# Patient Record
Sex: Male | Born: 1945 | Race: Asian | Hispanic: No | Marital: Married | Smoking: Never smoker
Health system: Southern US, Community
[De-identification: ages and names within clinical notes are randomized; demographics above are authoritative.]

## PROBLEM LIST (undated history)

## (undated) DIAGNOSIS — R0600 Dyspnea, unspecified: Secondary | ICD-10-CM

## (undated) DIAGNOSIS — R0609 Other forms of dyspnea: Secondary | ICD-10-CM

## (undated) DIAGNOSIS — K828 Other specified diseases of gallbladder: Secondary | ICD-10-CM

## (undated) DIAGNOSIS — I10 Essential (primary) hypertension: Secondary | ICD-10-CM

## (undated) HISTORY — DX: Other forms of dyspnea: R06.09

## (undated) HISTORY — DX: Dyspnea, unspecified: R06.00

## (undated) HISTORY — DX: Other specified diseases of gallbladder: K82.8

---

## 2002-03-14 HISTORY — PX: LAPAROSCOPIC CHOLECYSTECTOMY: SUR755

## 2002-03-14 HISTORY — PX: INTRAOPERATIVE CHOLANGIOGRAM: SHX5230

## 2002-12-19 ENCOUNTER — Ambulatory Visit (HOSPITAL_COMMUNITY): Admission: RE | Admit: 2002-12-19 | Discharge: 2002-12-19 | Payer: Self-pay | Admitting: Cardiology

## 2003-01-08 ENCOUNTER — Encounter: Admission: RE | Admit: 2003-01-08 | Discharge: 2003-01-08 | Payer: Self-pay | Admitting: Gastroenterology

## 2003-02-10 ENCOUNTER — Ambulatory Visit (HOSPITAL_COMMUNITY): Admission: RE | Admit: 2003-02-10 | Discharge: 2003-02-10 | Payer: Self-pay | Admitting: Gastroenterology

## 2003-02-27 ENCOUNTER — Observation Stay (HOSPITAL_COMMUNITY): Admission: RE | Admit: 2003-02-27 | Discharge: 2003-02-28 | Payer: Self-pay | Admitting: Surgery

## 2006-01-10 ENCOUNTER — Other Ambulatory Visit: Admission: RE | Admit: 2006-01-10 | Discharge: 2006-01-10 | Payer: Self-pay | Admitting: General Surgery

## 2006-01-23 ENCOUNTER — Other Ambulatory Visit: Admission: RE | Admit: 2006-01-23 | Discharge: 2006-01-23 | Payer: Self-pay | Admitting: Diagnostic Radiology

## 2010-06-11 ENCOUNTER — Encounter: Payer: Self-pay | Admitting: Cardiovascular Disease

## 2010-06-14 ENCOUNTER — Encounter: Payer: Self-pay | Admitting: Cardiovascular Disease

## 2010-06-14 ENCOUNTER — Ambulatory Visit (INDEPENDENT_AMBULATORY_CARE_PROVIDER_SITE_OTHER): Payer: PRIVATE HEALTH INSURANCE | Admitting: Cardiovascular Disease

## 2010-06-14 VITALS — BP 121/81 | HR 75 | Resp 14 | Ht 70.0 in | Wt 166.0 lb

## 2010-06-14 DIAGNOSIS — R55 Syncope and collapse: Secondary | ICD-10-CM

## 2010-06-14 DIAGNOSIS — I10 Essential (primary) hypertension: Secondary | ICD-10-CM

## 2010-06-14 NOTE — Assessment & Plan Note (Signed)
Normal exam and ECG.  May be benign cough syncope.  Negative w/u for similar 7 years ago per patient.  F/U stress echo.  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

## 2010-06-14 NOTE — Progress Notes (Signed)
65 yo [place] male referred from [facility] for "syncope".  Had episode 7 years ago with negative cardiac w/u.  Does not remember who saw him.  No intervening episodes.  Only records in echart involve cholycystectomy in 2004.  Two weeks ago had some abdominal pain and cough.  Occurred at home not work.  No palpitations or SSCP.  Mild chronic exertional dyspnea.  Describes "falling out" No history of head trauma or seizures.  May have cut his lip a bit but no major trauma.  No post ictal state.  No previous history of significant arrhythmia or cardiac problem.  CRF HTN on Rx.  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    ROS: Denies fever, malais, weight loss, blurry vision, decreased visual acuity, cough, sputum, SOB, hemoptysis, pleuritic pain, palpitaitons, heartburn, abdominal pain, melena, lower extremity edema, claudication, or rash.   General: Affect appropriate Healthy:  appears stated age HEENT: normal Neck supple with no adenopathy JVP normal no bruits no thyromegaly Lungs clear with no wheezing and good diaphragmatic motion Heart:  S1/S2 no murmur,rub, gallop or click PMI normal Abdomen: benighn, BS positve, no tenderness, no AAA no bruit.  No HSM or HJR Distal pulses intact with no bruits No edema Neuro non-focal Skin warm and dry No muscular weakness   Current Outpatient Prescriptions  Medication Sig Dispense Refill  . lisinopril (PRINIVIL,ZESTRIL) 10 MG tablet Take 10 mg by mouth daily.        . Multiple Vitamin (MULTIVITAMIN) capsule Take 1 capsule by mouth daily.          Allergies  Review of patient's allergies indicates no known allergies.  Family History :  Negative for premature CAD  Social:  Married  Came from [place] in 80;s  Older children.  Sedentary with occasional ETOH and no smoking  Electrocardiogram:      NSR 75 Normal ECG QT 376 Assessment and Plan

## 2010-06-14 NOTE — Patient Instructions (Signed)
Your physician has requested that you have a stress echocardiogram. For further information please visit [email or web address] Please follow instruction sheet as given.   REFERRAL TO EP FOR SYNCOPE

## 2010-06-14 NOTE — Assessment & Plan Note (Signed)
Well controlled continue ACe

## 2010-06-15 NOTE — Progress Notes (Signed)
Addended by: Kem Parkinson on: 06/15/2010 04:49 PM   Modules accepted: Orders

## 2010-06-28 ENCOUNTER — Ambulatory Visit (HOSPITAL_COMMUNITY): Payer: PRIVATE HEALTH INSURANCE | Attending: Cardiovascular Disease | Admitting: Radiology

## 2010-06-28 ENCOUNTER — Ambulatory Visit (HOSPITAL_BASED_OUTPATIENT_CLINIC_OR_DEPARTMENT_OTHER): Payer: PRIVATE HEALTH INSURANCE | Admitting: Radiology

## 2010-06-28 DIAGNOSIS — R55 Syncope and collapse: Secondary | ICD-10-CM | POA: Insufficient documentation

## 2010-06-28 DIAGNOSIS — R072 Precordial pain: Secondary | ICD-10-CM

## 2010-06-28 DIAGNOSIS — R0989 Other specified symptoms and signs involving the circulatory and respiratory systems: Secondary | ICD-10-CM

## 2010-06-29 ENCOUNTER — Encounter: Payer: Self-pay | Admitting: Cardiovascular Disease

## 2010-07-08 ENCOUNTER — Telehealth: Payer: Self-pay | Admitting: Cardiovascular Disease

## 2010-07-08 DIAGNOSIS — R55 Syncope and collapse: Secondary | ICD-10-CM

## 2010-07-08 NOTE — Telephone Encounter (Signed)
Patient wanted results from stress echo. She is aware that Dr. Eden Emms would like for her to have a lexiscan myoview. I have put the order in & will have the PCC's schedule this.

## 2010-07-19 ENCOUNTER — Ambulatory Visit (HOSPITAL_COMMUNITY): Payer: PRIVATE HEALTH INSURANCE | Attending: Cardiovascular Disease | Admitting: Radiology

## 2010-07-19 DIAGNOSIS — I4949 Other premature depolarization: Secondary | ICD-10-CM

## 2010-07-19 DIAGNOSIS — R0609 Other forms of dyspnea: Secondary | ICD-10-CM

## 2010-07-19 DIAGNOSIS — R0602 Shortness of breath: Secondary | ICD-10-CM

## 2010-07-19 DIAGNOSIS — R55 Syncope and collapse: Secondary | ICD-10-CM

## 2010-07-19 MED ORDER — TECHNETIUM TC 99M TETROFOSMIN IV KIT
11.0000 | PACK | Freq: Once | INTRAVENOUS | Status: AC | PRN
  Administered 2010-07-19: 11 via INTRAVENOUS

## 2010-07-19 MED ORDER — REGADENOSON 0.4 MG/5ML IV SOLN
0.4000 mg | Freq: Once | INTRAVENOUS | Status: AC
  Administered 2010-07-19: 0.4 mg via INTRAVENOUS

## 2010-07-19 MED ORDER — TECHNETIUM TC 99M TETROFOSMIN IV KIT
33.0000 | PACK | Freq: Once | INTRAVENOUS | Status: AC | PRN
  Administered 2010-07-19: 33 via INTRAVENOUS

## 2010-07-19 NOTE — Progress Notes (Signed)
[facility] SITE 3 NUCLEAR MED [address] [place] [address] [phone]  Cardiology Nuclear Med Study  Eric Love is a 65 y.o. male [id number] Dec 10, 1945   Nuclear Med Background Indication for Stress Test:  Evaluation for Ischemia and 06/28/10 Stress echo was nondiagnostic with inferior wall not seen. History: 06/28/10 Echo: Stress NonDx EF 55% Poor imaging quality Cardiac Risk Factors: Hypertension  Symptoms:  DOE, Fatigue and Syncope   Nuclear Pre-Procedure Caffeine/Decaff Intake:  None NPO After: 8:00am   Lungs: clear IV 0.9% NS with Angio Cath:  20g  IV Site: R Forearm  IV Started by:  Cathlyn Parsons, RN  Chest Size (in):  40 Cup Size: n/a  Height: 5\' 8"  (1.727 m)  Weight:  159 lb (72.122 kg)  BMI:  Body mass index is 24.18 kg/(m^2). Tech Comments:  n/a    Nuclear Med Study 1 or 2 day study: 1 day  Stress Test Type:  Treadmill/Lexiscan  Reading MD: Charlton Haws, MD  Order Authorizing Provider:  P.Nishan  Resting Radionuclide: Technetium 25m Tetrofosmin  Resting Radionuclide Dose: 11.0 mCi   Stress Radionuclide:  Technetium 34m Tetrofosmin  Stress Radionuclide Dose: 33.0 mCi           Stress Protocol Rest HR: 68 Stress HR: 100  Rest BP: 120/78 Stress BP: 128/81  Exercise Time (min): n/a METS: n/a   Predicted Max HR: 156 bpm % Max HR: 64.1 bpm Rate Pressure Product: 13244   Dose of Adenosine (mg):  n/a Dose of Lexiscan: 0.4 mg  Dose of Atropine (mg): n/a Dose of Dobutamine: n/a mcg/kg/min (at max HR)  Stress Test Technologist: Milana Na, EMT-P  Nuclear Technologist:  Domenic Polite, CNMT     Rest Procedure:  Myocardial perfusion imaging was performed at rest 45 minutes following the intravenous administration of Technetium 12m Tetrofosmin. Rest ECG: NSR PVCS  Stress Procedure:  The patient received IV Lexiscan 0.4 mg over 15-seconds with concurrent low level exercise and then Technetium 7m Tetrofosmin was injected at  30-seconds while the patient continued walking one more minute.  There were no significant changes and occ pvcs with Lexiscan.  Quantitative spect images were obtained after a 45-minute delay. Stress ECG: No significant change from baseline ECG  QPS Raw Data Images:  Normal; no motion artifact; normal heart/lung ratio. Stress Images:  Normal homogeneous uptake in all areas of the myocardium. Rest Images:  Normal homogeneous uptake in all areas of the myocardium. Subtraction (SDS):  Normal Transient Ischemic Dilatation (Normal <1.22):  0.95 Lung/Heart Ratio (Normal <0.45):  0.29  Quantitative Gated Spect Images QGS EDV:  83 ml QGS ESV:  29 ml QGS cine images:  NL LV Function; NL Wall Motion QGS EF: 65%  Impression Exercise Capacity:  Lexiscan with no exercise. BP Response:  Normal blood pressure response. Clinical Symptoms:  No chest pain. ECG Impression:  No significant ST segment change suggestive of ischemia. Comparison with Prior Nuclear Study: No images to compare  Overall Impression:  Normal stress nuclear study.       Charlton Haws

## 2010-07-20 NOTE — Progress Notes (Signed)
ROUTED TO DR. NISHAN.Falecha Clark °

## 2010-07-28 NOTE — Progress Notes (Signed)
pt aware of myoview results Shaolin Armas  

## 2017-03-20 ENCOUNTER — Other Ambulatory Visit: Payer: Self-pay

## 2017-03-20 ENCOUNTER — Emergency Department (HOSPITAL_COMMUNITY): Payer: Medicare Other

## 2017-03-20 ENCOUNTER — Emergency Department (HOSPITAL_COMMUNITY)
Admission: EM | Admit: 2017-03-20 | Discharge: 2017-03-20 | Disposition: A | Discharge status: Home / Self Care | Payer: Medicare Other | Attending: Emergency Medicine | Admitting: Emergency Medicine

## 2017-03-20 ENCOUNTER — Encounter (HOSPITAL_COMMUNITY): Payer: Self-pay

## 2017-03-20 DIAGNOSIS — R112 Nausea with vomiting, unspecified: Secondary | ICD-10-CM | POA: Diagnosis not present

## 2017-03-20 DIAGNOSIS — K828 Other specified diseases of gallbladder: Secondary | ICD-10-CM | POA: Insufficient documentation

## 2017-03-20 DIAGNOSIS — I10 Essential (primary) hypertension: Secondary | ICD-10-CM | POA: Insufficient documentation

## 2017-03-20 DIAGNOSIS — Z79899 Other long term (current) drug therapy: Secondary | ICD-10-CM | POA: Diagnosis not present

## 2017-03-20 DIAGNOSIS — R42 Dizziness and giddiness: Secondary | ICD-10-CM | POA: Diagnosis not present

## 2017-03-20 LAB — COMPREHENSIVE METABOLIC PANEL
ALT: 25 U/L (ref 17–63)
AST: 26 U/L (ref 15–41)
Albumin: 4.3 g/dL (ref 3.5–5.0)
Alkaline Phosphatase: 60 U/L (ref 38–126)
Anion gap: 8 (ref 5–15)
BUN: 17 mg/dL (ref 6–20)
CO2: 26 mmol/L (ref 22–32)
Calcium: 9.7 mg/dL (ref 8.9–10.3)
Chloride: 103 mmol/L (ref 101–111)
Creatinine, Ser: 1.03 mg/dL (ref 0.61–1.24)
GFR calc Af Amer: >60 mL/min (ref >60)
GFR calc non Af Amer: >60 mL/min (ref >60)
Glucose, Bld: 126 mg/dL — ABNORMAL HIGH (ref 65–99)
Potassium: 4.3 mmol/L (ref 3.5–5.1)
Sodium: 137 mmol/L (ref 135–145)
Total Bilirubin: 1.3 mg/dL — ABNORMAL HIGH (ref 0.3–1.2)
Total Protein: 8.6 g/dL — ABNORMAL HIGH (ref 6.5–8.1)

## 2017-03-20 LAB — URINALYSIS, ROUTINE W REFLEX MICROSCOPIC
Bilirubin Urine: NEGATIVE
Glucose, UA: NEGATIVE mg/dL
Hgb urine dipstick: NEGATIVE
Ketones, ur: 5 mg/dL — AB
Leukocytes, UA: NEGATIVE
Nitrite: NEGATIVE
Protein, ur: NEGATIVE mg/dL
Specific Gravity, Urine: 1.014 (ref 1.005–1.030)
pH: 7 (ref 5.0–8.0)

## 2017-03-20 LAB — I-STAT TROPONIN, ED: Troponin i, poc: 0 ng/mL (ref 0.00–0.08)

## 2017-03-20 LAB — CBC
HCT: 42.5 % (ref 39.0–52.0)
Hemoglobin: 14.5 g/dL (ref 13.0–17.0)
MCH: 27.8 pg (ref 26.0–34.0)
MCHC: 34.1 g/dL (ref 30.0–36.0)
MCV: 81.4 fL (ref 78.0–100.0)
Platelets: 332 10*3/uL (ref 150–400)
RBC: 5.22 MIL/uL (ref 4.22–5.81)
RDW: 12.8 % (ref 11.5–15.5)
WBC: 17.7 10*3/uL — ABNORMAL HIGH (ref 4.0–10.5)

## 2017-03-20 LAB — LIPASE, BLOOD: Lipase: 31 U/L (ref 11–51)

## 2017-03-20 MED ORDER — ONDANSETRON 4 MG PO TBDP: 4.0000 mg | ORAL_TABLET | Freq: Three times a day (TID) | ORAL | 0 refills | Status: AC | PRN

## 2017-03-20 MED ORDER — ONDANSETRON 4 MG PO TBDP
4.0000 mg | ORAL_TABLET | Freq: Once | ORAL | Status: AC | PRN
  Administered 2017-03-20: 4 mg via ORAL
  Filled 2017-03-20: qty 1

## 2017-03-20 MED ORDER — LACTATED RINGERS IV BOLUS (SEPSIS)
1000.0000 mL | Freq: Once | INTRAVENOUS | Status: AC
  Administered 2017-03-20: 1000 mL via INTRAVENOUS

## 2017-03-20 MED ORDER — MECLIZINE HCL 25 MG PO TABS: 25.0000 mg | ORAL_TABLET | Freq: Three times a day (TID) | ORAL | 0 refills | Status: AC | PRN

## 2017-03-20 NOTE — ED Provider Notes (Addendum)
[facility]-EMERGENCY DEPT Provider Note   CSN: [id number] Arrival date & time: 03/20/17  0554     History   Chief Complaint Chief Complaint  Patient presents with  . Emesis  . Dizziness    HPI Eric Love is a 72 y.o. male.  HPI  Patient with history of biliary dyskinesia status post cholecystectomy comes in with chief complaint of dizziness and nausea.  Patient reports that he started having nausea yesterday evening.  Patient has had multiple episodes of emesis since then.  The last emesis was bilious.  Patient also reports feeling dizzy, specifically when he is walking.  Patient denies any associated numbness, tingling, vision changes.  Patient does not have any history of stroke.  With the nausea and emesis, there is no abdominal pain or distention.  Patient reports that he had a bowel movement yesterday, however it was small.  Patient is unsure if he is passing flatus or not, no hx of SBO.  Past Medical History:  Diagnosis Date  . Biliary dyskinesia   . Dyspnea on exertion     Patient Active Problem List   Diagnosis Date Noted  . Syncope 06/14/2010  . Essential hypertension, benign 06/14/2010    Past Surgical History:  Procedure Laterality Date  . INTRAOPERATIVE CHOLANGIOGRAM  2004  . LAPAROSCOPIC CHOLECYSTECTOMY  2004       Home Medications    Prior to Admission medications   Medication Sig Start Date End Date Taking? Authorizing Provider  lisinopril (PRINIVIL,ZESTRIL) 10 MG tablet Take 10 mg by mouth daily.     Yes [provider]  Multiple Vitamin (MULTIVITAMIN WITH MINERALS) TABS tablet Take 1 tablet by mouth daily.   Yes [provider]  Polyvinyl Alcohol-Povidone (REFRESH OP) Apply 1 drop to eye at bedtime.   Yes [provider]  tamsulosin (FLOMAX) 0.4 MG CAPS capsule Take 0.4 mg by mouth every evening. 01/04/17  Yes [provider]  meclizine (ANTIVERT) 25 MG tablet Take 1 tablet (25 mg total)  by mouth 3 (three) times daily as needed for dizziness. 03/20/17   Derwood Kaplan, MD  ondansetron (ZOFRAN ODT) 4 MG disintegrating tablet Take 1 tablet (4 mg total) by mouth every 8 (eight) hours as needed for nausea or vomiting. 03/20/17   Derwood Kaplan, MD    Family History No family history on file.  Social History Social History   Tobacco Use  . Smoking status: Never Smoker  . Smokeless tobacco: Never Used  Substance Use Topics  . Alcohol use: No    Frequency: Never  . Drug use: No     Allergies   Patient has no known allergies.   Review of Systems Review of Systems  All other systems reviewed and are negative.    Physical Exam Updated Vital Signs BP 135/84 (BP Location: Left Arm)   Pulse 87   Temp 98 F (36.7 C)   Resp 16   Ht 5\' 5"  (1.651 m)   Wt 69.9 kg (154 lb)   SpO2 100%   BMI 25.63 kg/m   Physical Exam  Constitutional: He is oriented to person, place, and time. He appears well-developed.  HENT:  Head: Atraumatic.  Neck: Neck supple.  Cardiovascular: Normal rate.  Pulmonary/Chest: Effort normal.  Musculoskeletal: He exhibits no edema or tenderness.  Neurological: He is alert and oriented to person, place, and time. No cranial nerve deficit. Coordination normal.  Cerebellar exam is normal (finger to nose) Sensory exam normal for bilateral upper  and lower extremities - and patient is able to discriminate between sharp and dull. Motor exam is 4+/5   Skin: Skin is warm.  Nursing note and vitals reviewed.    ED Treatments / Results  Labs (all labs ordered are listed, but only abnormal results are displayed) Labs Reviewed  COMPREHENSIVE METABOLIC PANEL - Abnormal; Notable for the following components:      Result Value   Glucose, Bld 126 (*)    Total Protein 8.6 (*)    Total Bilirubin 1.3 (*)    All other components within normal limits  CBC - Abnormal; Notable for the following components:   WBC 17.7 (*)    All other components within  normal limits  URINALYSIS, ROUTINE W REFLEX MICROSCOPIC - Abnormal; Notable for the following components:   Ketones, ur 5 (*)    All other components within normal limits  LIPASE, BLOOD  I-STAT TROPONIN, ED    EKG  EKG Interpretation  Date/Time:  Monday March 20 2017 08:43:57 EST Ventricular Rate:  84 PR Interval:    QRS Duration: 75 QT Interval:  362 QTC Calculation: 428 R Axis:   69 Text Interpretation:  Sinus rhythm Consider left atrial enlargement Anterior infarct, old ST elevation, consider inferior injury No acute changes Confirmed by Derwood Kaplananavati, Mariyanna Mucha ([id number]) on 03/20/2017 9:32:11 AM       Radiology Mr Brain Wo Contrast  Result Date: 03/20/2017 CLINICAL DATA:  Vertigo.  Acute presentation. EXAM: MRI HEAD WITHOUT CONTRAST TECHNIQUE: Multiplanar, multiecho pulse sequences of the brain and surrounding structures were obtained without intravenous contrast. COMPARISON:  Head CT 05/26/2010. FINDINGS: Brain: The brain has normal appearance without evidence of malformation, atrophy, old or acute small or large vessel infarction, hemorrhage, hydrocephalus or extra-axial collection. No pituitary abnormality. CP angle regions appear normal. Seventh and eighth nerve complexes appear normal without contrast. Vascular: Major vessels at the base of the brain show flow. Skull and upper cervical spine: Normal Sinuses/Orbits: Clear/ normal. Other: None significant. IMPRESSION: Normal examination.  No cause of vertigo is identified. Electronically Signed   By: Paulina FusiMark  Shogry M.D.   On: 03/20/2017 15:12   Dg Abd Acute W/chest  Result Date: 03/20/2017 CLINICAL DATA:  Nausea, vomiting, dizziness EXAM: DG ABDOMEN ACUTE W/ 1V CHEST COMPARISON:  05/26/2010 FINDINGS: Prior cholecystectomy. The bowel gas pattern is normal. There is no evidence of free intraperitoneal air. No suspicious radio-opaque calculi or other significant radiographic abnormality is seen. Heart size and mediastinal contours are within  normal limits. Both lungs are clear. IMPRESSION: Negative abdominal radiographs.  No acute cardiopulmonary disease. Electronically Signed   By: Charlett NoseKevin  Dover M.D.   On: 03/20/2017 09:19    Procedures Procedures (including critical care time)  Medications Ordered in ED Medications  ondansetron (ZOFRAN-ODT) disintegrating tablet 4 mg (4 mg Oral Given 03/20/17 0726)  lactated ringers bolus 1,000 mL (0 mLs Intravenous Stopped 03/20/17 1009)  lactated ringers bolus 1,000 mL (0 mLs Intravenous Stopped 03/20/17 1254)     Initial Impression / Assessment and Plan / ED Course  I have reviewed the triage vital signs and the nursing notes.  Pertinent labs & imaging results that were available during my care of the patient were reviewed by me and considered in my medical decision making (see chart for details).   Patient comes in with chief complaint of nausea with bilious emesis.  Patient does not have associated abdominal pain or distention.  Patient does have history of cholecystectomy, and he reports having abnormal bowel movements.  Differential diagnosis includes small bowel obstruction (partial vs. Complete), ileus, gastroparesis, gastroenteritis.  Additionally, patient is having nausea and dizziness, which could be related to stroke.  Patient on history appears to have orthostatic-like symptoms, which could be because of dehydration from his emesis.  We will order orthostatics and give patient fluids and reassess.  Final Clinical Impressions(s) / ED Diagnoses   Final diagnoses:  Dizziness  Orthostatic dizziness    ED Discharge Orders        Ordered    meclizine (ANTIVERT) 25 MG tablet  3 times daily PRN     03/20/17 1542    ondansetron (ZOFRAN ODT) 4 MG disintegrating tablet  Every 8 hours PRN     03/20/17 1543       Derwood Kaplan, MD 03/20/17 1552    Derwood Kaplan, MD 03/20/17 1600

## 2017-03-20 NOTE — ED Notes (Signed)
Per MD, patient will go to MRI between 1700-1800.

## 2017-03-20 NOTE — ED Notes (Signed)
Patient's daughter, Dewayne Hatchnn, [phone]

## 2017-03-20 NOTE — ED Notes (Signed)
Patient transported to MRI 

## 2017-03-20 NOTE — ED Notes (Signed)
Per patient request, patient's daughter made aware of patient's d/c and reports she will come to pick him up.

## 2017-03-20 NOTE — ED Notes (Signed)
Per MD, patient can eat and drink. Patient given Mal[place]urkey sandwich and water.

## 2017-03-20 NOTE — Discharge Instructions (Signed)
All the results in the ER are normal, labs and imaging. We are not sure what is causing your symptoms. We suspect that the symptoms are likely due to dehydration/orthostasis or due to benign positional vertigo. Take the medications prescribed as needed.  The workup in the ER is not complete, and is limited to screening for life threatening and emergent conditions only, so please see a primary care doctor for further evaluation in 5 days.  Please return to the ER if your symptoms worsen; you have increased pain, fevers, chills, inability to keep any medications down, confusion. Otherwise see the outpatient doctor as requested.

## 2017-03-20 NOTE — ED Triage Notes (Signed)
Patient c/o vomiting and dizziness since yesterday. Patient denies any abdominal pain or diarrhea.

## 2017-09-13 ENCOUNTER — Encounter (HOSPITAL_COMMUNITY): Payer: Self-pay | Admitting: Emergency Medicine

## 2017-09-13 ENCOUNTER — Ambulatory Visit (HOSPITAL_COMMUNITY)
Admission: EM | Admit: 2017-09-13 | Discharge: 2017-09-13 | Disposition: A | Discharge status: Home / Self Care | Payer: Medicare Other | Attending: Family Medicine | Admitting: Family Medicine

## 2017-09-13 DIAGNOSIS — L237 Allergic contact dermatitis due to plants, except food: Secondary | ICD-10-CM

## 2017-09-13 MED ORDER — TRIAMCINOLONE ACETONIDE 0.5 % EX OINT: 1.0000 "application " | TOPICAL_OINTMENT | Freq: Two times a day (BID) | CUTANEOUS | 0 refills | Status: AC

## 2017-09-13 MED ORDER — HYDROXYZINE HCL 25 MG PO TABS: 25.0000 mg | ORAL_TABLET | Freq: Every evening | ORAL | 0 refills | Status: AC | PRN

## 2017-09-13 NOTE — ED Provider Notes (Signed)
[facility] CARE [facility]   [id number] 09/13/17 Arrival Time: 1449  SUBJECTIVE:  Eric Love is a 72 y.o. male who presents with a rash to right arm.  Began after cleaning out backyard. Localizes the rash to inside of right forearm  Describes it as itchy.  Has tried OTC medications/ creams with temporary improvement.  Symptoms are made worse with itching.  Denies similar symptoms in the past.  Denies fever, chills, nausea, vomiting, erythema, redness, swollen glands, SOB, chest pain, abdominal pain, changes in bowel or bladder function.    ROS: As per HPI.  Past Medical History:  Diagnosis Date  . Biliary dyskinesia   . Dyspnea on exertion    Past Surgical History:  Procedure Laterality Date  . INTRAOPERATIVE CHOLANGIOGRAM  2004  . LAPAROSCOPIC CHOLECYSTECTOMY  2004   No Known Allergies No current facility-administered medications on file prior to encounter.    Current Outpatient Medications on File Prior to Encounter  Medication Sig Dispense Refill  . lisinopril (PRINIVIL,ZESTRIL) 10 MG tablet Take 10 mg by mouth daily.      . meclizine (ANTIVERT) 25 MG tablet Take 1 tablet (25 mg total) by mouth 3 (three) times daily as needed for dizziness. 30 tablet 0  . Multiple Vitamin (MULTIVITAMIN WITH MINERALS) TABS tablet Take 1 tablet by mouth daily.    . ondansetron (ZOFRAN ODT) 4 MG disintegrating tablet Take 1 tablet (4 mg total) by mouth every 8 (eight) hours as needed for nausea or vomiting. 20 tablet 0  . Polyvinyl Alcohol-Povidone (REFRESH OP) Apply 1 drop to eye at bedtime.    . tamsulosin (FLOMAX) 0.4 MG CAPS capsule Take 0.4 mg by mouth every evening.  3   Social History   Socioeconomic History  . Marital status: Married    Spouse name: Not on file  . Number of children: Not on file  . Years of education: Not on file  . Highest education level: Not on file  Occupational History  . Not on file  Social Needs  . Financial resource strain: Not on file  . Food insecurity:   Worry: Not on file    Inability: Not on file  . Transportation needs:    Medical: Not on file    Non-medical: Not on file  Tobacco Use  . Smoking status: Never Smoker  . Smokeless tobacco: Never Used  Substance and Sexual Activity  . Alcohol use: No    Frequency: Never  . Drug use: No  . Sexual activity: Not on file  Lifestyle  . Physical activity:    Days per week: Not on file    Minutes per session: Not on file  . Stress: Not on file  Relationships  . Social connections:    Talks on phone: Not on file    Gets together: Not on file    Attends religious service: Not on file    Active member of club or organization: Not on file    Attends meetings of clubs or organizations: Not on file    Relationship status: Not on file  . Intimate partner violence:    Fear of current or ex partner: Not on file    Emotionally abused: Not on file    Physically abused: Not on file    Forced sexual activity: Not on file  Other Topics Concern  . Not on file  Social History Narrative  . Not on file   History reviewed. No pertinent family history.  OBJECTIVE: Vitals:   09/13/17 1508  BP: 111/76  Pulse: 82  Resp: 18  Temp: 97.7 F (36.5 C)  TempSrc: Oral  SpO2: 95%    General appearance: alert; no distress Lungs: clear to auscultation bilaterally Heart: regular rate and rhythm.  Radial pulse 2+ bilaterally Extremities: no edema Skin: warm and dry; scatter papules and vesicles with surrounding erythema in linear distribution on the anterior medial aspect of right forearm areas of linear  Psychological: alert and cooperative; normal mood and affect  ASSESSMENT & PLAN:  1. Allergic contact dermatitis due to plants, except food     Meds ordered this encounter  Medications  . triamcinolone ointment (KENALOG) 0.5 %    Sig: Apply 1 application topically 2 (two) times daily.    Dispense:  30 g    Refill:  0    Order Specific Question:   Supervising Provider    Answer:   Isa RankinMURRAY,  LAURA WILSON [phone][[id number]]  . hydrOXYzine (ATARAX/VISTARIL) 25 MG tablet    Sig: Take 1 tablet (25 mg total) by mouth at bedtime as needed for itching.    Dispense:  12 tablet    Refill:  0    Order Specific Question:   Supervising Provider    Answer:   Isa RankinMURRAY, LAURA WILSON [[id number]]   You can try oatmeal baths and cool, wet compresses Continue OTC medications/ cream as needed for symptomatic relief Prescribed kenalog cream.  Use as prescribed Prescribed hydroxyzine.  Use as needed for itching.  Avoid driving or operating heavy machinery while taking this medication.  It can make you drowsy Follow up with PCP if symptoms persists Return or go to the ER if you have any new or worsening symptoms   Reviewed expectations re: course of current medical issues. Questions answered. Outlined signs and symptoms indicating need for more acute intervention. Patient verbalized understanding. After Visit Summary given.   Rennis HardingWurst, Jaydn Fincher, PA-C 09/13/17 1556

## 2017-09-13 NOTE — Discharge Instructions (Addendum)
You can try oatmeal baths and cool, wet compresses Continue OTC medications/ cream as needed for symptomatic relief Prescribed kenalog cream.  Use as prescribed Prescribed hydroxyzine.  Use as needed for itching.  Avoid driving or operating heavy machinery while taking this medication.  It can make you drowsy Follow up with PCP if symptoms persists Return or go to the ER if you have any new or worsening symptoms

## 2017-09-13 NOTE — ED Triage Notes (Signed)
Pt here for rash from poison ivy 

## 2017-12-29 ENCOUNTER — Encounter: Payer: Self-pay | Admitting: Family Medicine

## 2018-01-23 ENCOUNTER — Encounter: Payer: Self-pay | Admitting: Internal Medicine

## 2019-12-06 IMAGING — CR DG ABDOMEN ACUTE W/ 1V CHEST
3 series · 3 of 3 positions shown · non-contrast
Comparison: 05/26/2010

CLINICAL DATA: Nausea, vomiting, dizziness

EXAM:
DG ABDOMEN ACUTE W/ 1V CHEST

[w chest pa]
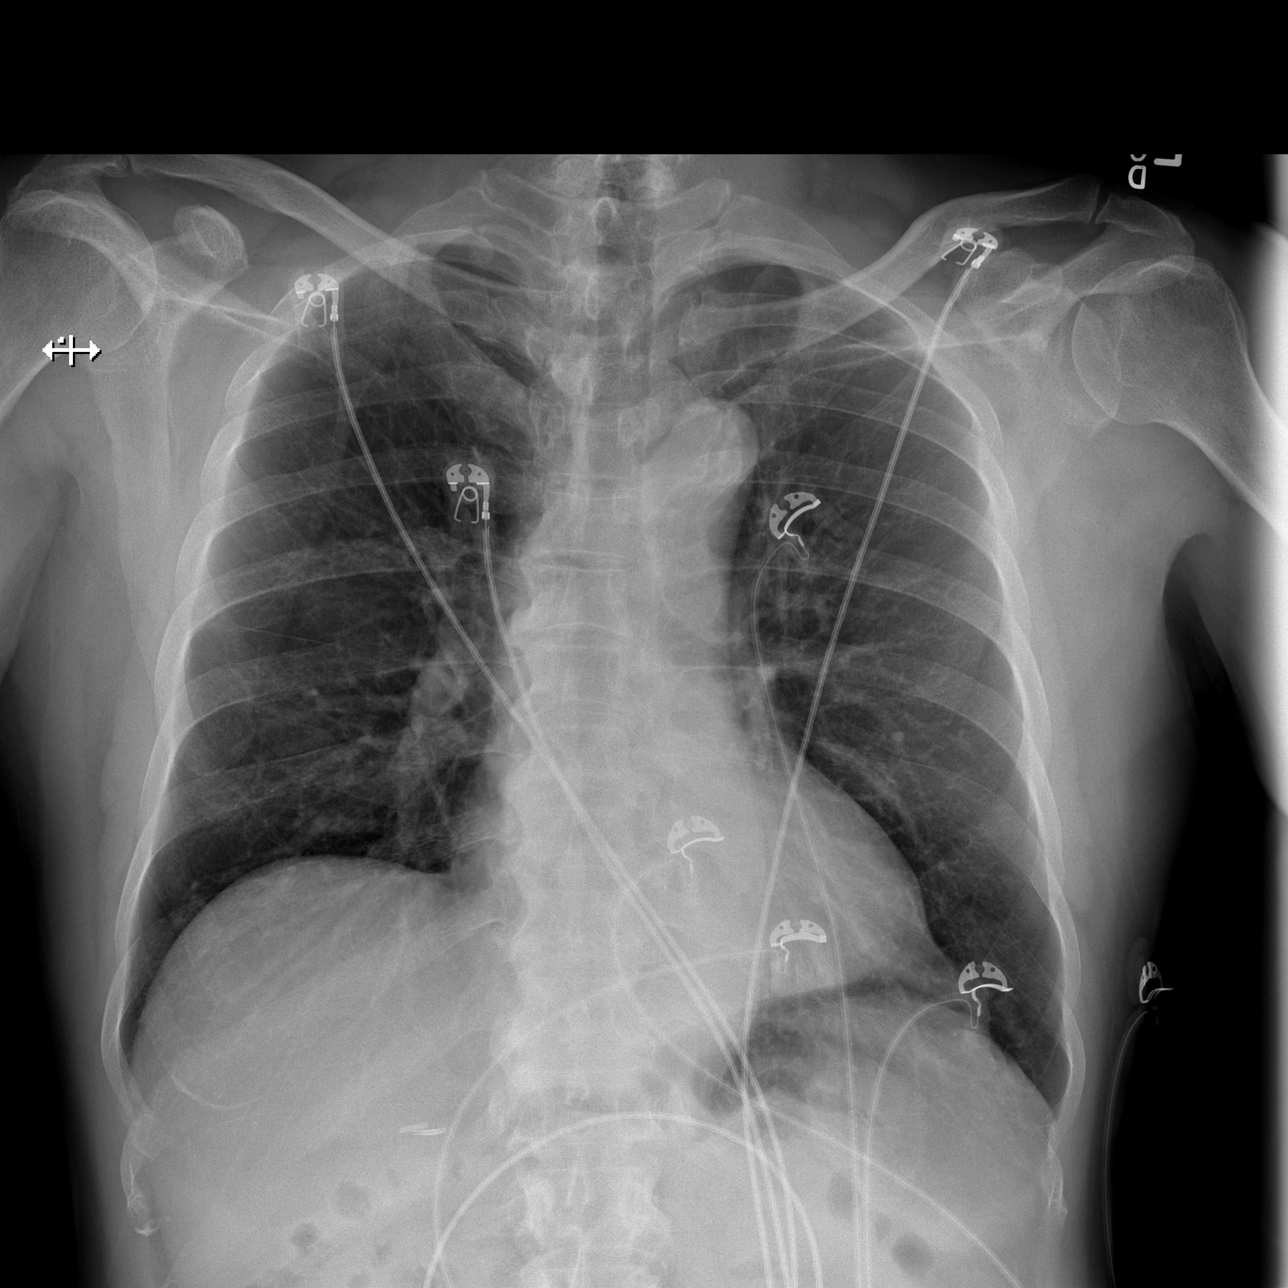

[w abdomen upright]
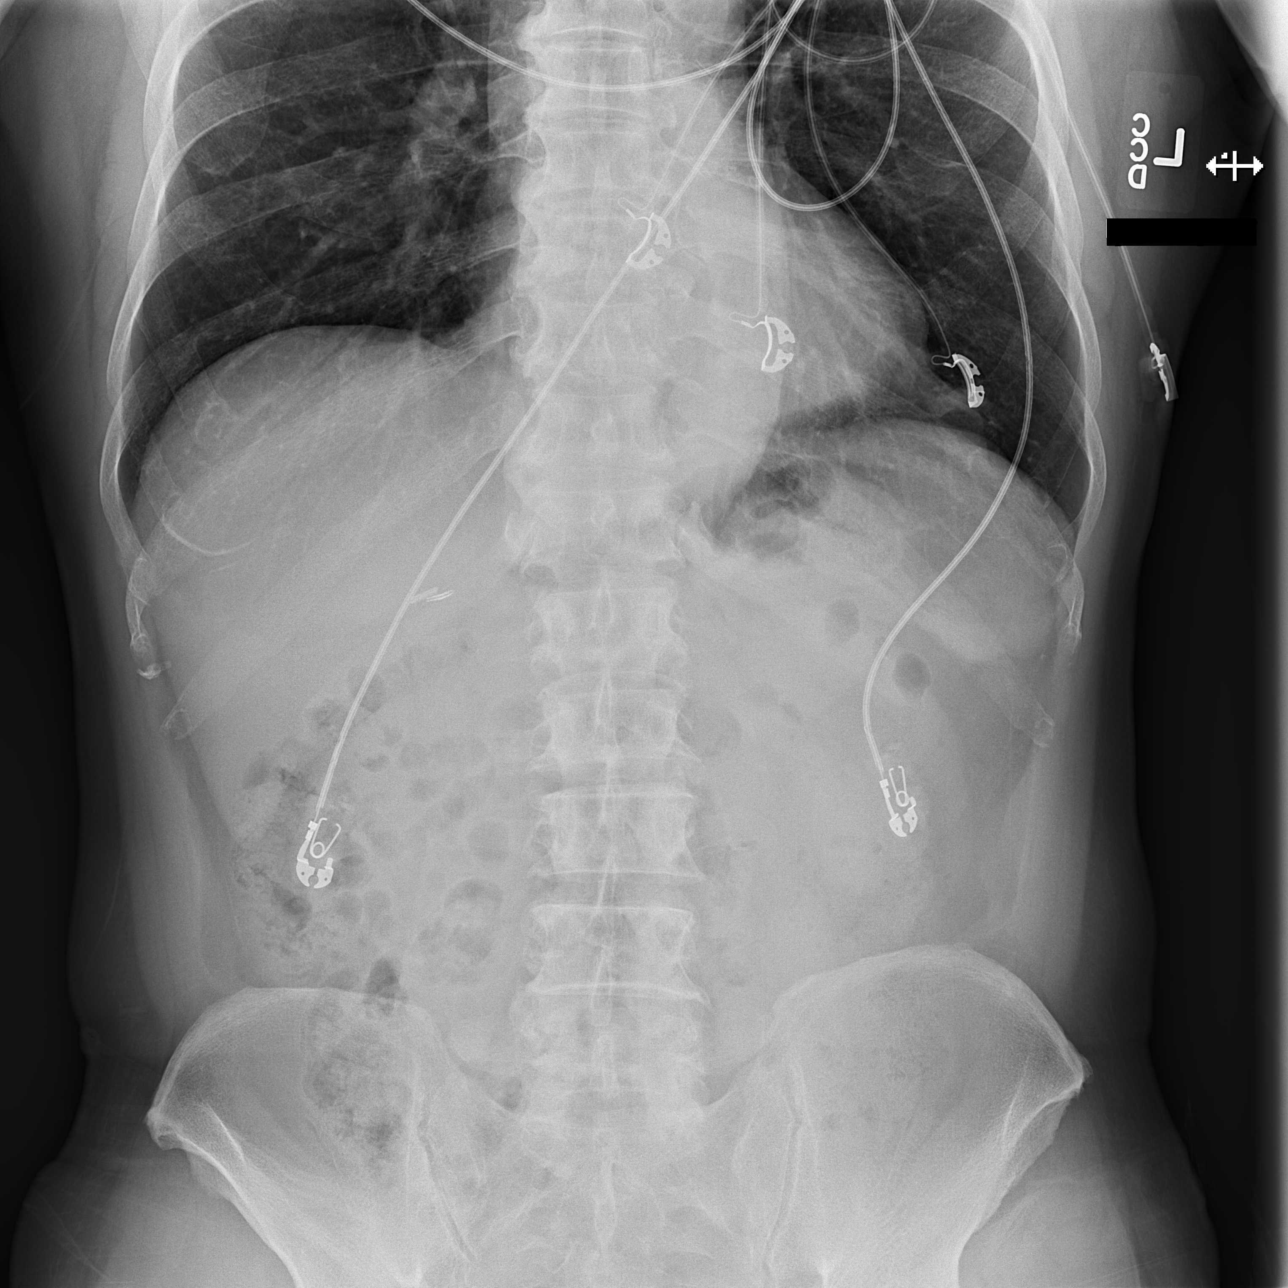

[t abdomen supine]
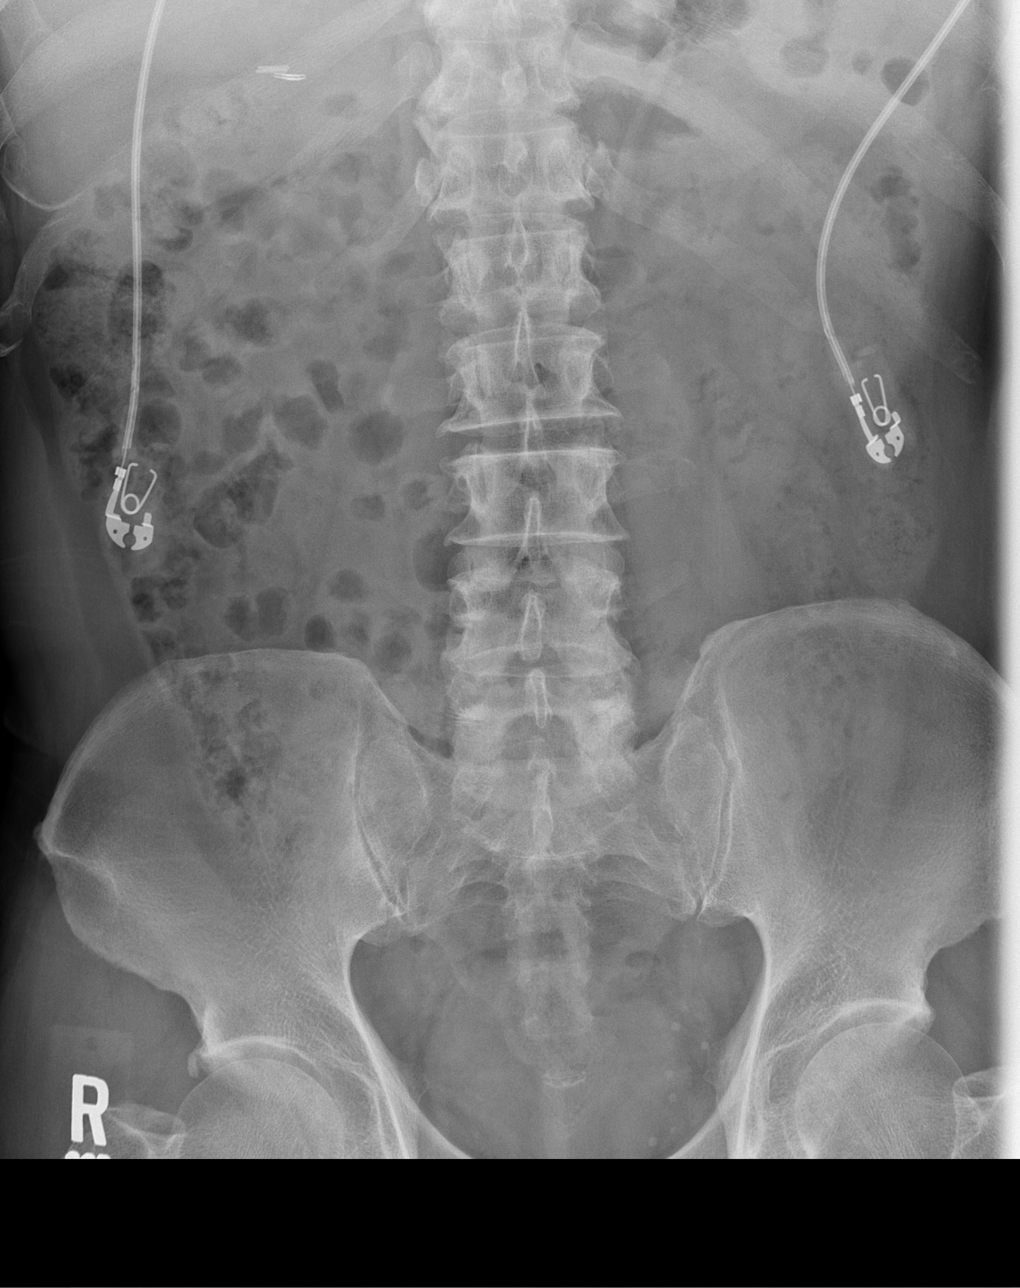

[3 of 3 positions shown; findings below may reference images not displayed]

FINDINGS: Prior cholecystectomy. The bowel gas pattern is normal. There is no
evidence of free intraperitoneal air. No suspicious radio-opaque
calculi or other significant radiographic abnormality is seen. Heart
size and mediastinal contours are within normal limits. Both lungs
are clear.
IMPRESSION: Negative abdominal radiographs.  No acute cardiopulmonary disease.

## 2023-06-12 ENCOUNTER — Ambulatory Visit (HOSPITAL_COMMUNITY)
Admission: EM | Admit: 2023-06-12 | Discharge: 2023-06-12 | Disposition: A | Discharge status: Home / Self Care | Attending: Family Medicine | Admitting: Family Medicine

## 2023-06-12 ENCOUNTER — Encounter (HOSPITAL_COMMUNITY): Payer: Self-pay

## 2023-06-12 DIAGNOSIS — J22 Unspecified acute lower respiratory infection: Secondary | ICD-10-CM | POA: Diagnosis not present

## 2023-06-12 HISTORY — DX: Essential (primary) hypertension: I10

## 2023-06-12 MED ORDER — PREDNISONE 20 MG PO TABS: 20.0000 mg | ORAL_TABLET | Freq: Every day | ORAL | 0 refills | Status: AC

## 2023-06-12 MED ORDER — PROMETHAZINE-DM 6.25-15 MG/5ML PO SYRP: 5.0000 mL | ORAL_SOLUTION | Freq: Three times a day (TID) | ORAL | 0 refills | Status: AC | PRN

## 2023-06-12 MED ORDER — AMOXICILLIN-POT CLAVULANATE 875-125 MG PO TABS: 1.0000 | ORAL_TABLET | Freq: Two times a day (BID) | ORAL | 0 refills | Status: AC

## 2023-06-12 NOTE — ED Triage Notes (Signed)
 Patient reports that he has been having a productive cough with yellow greens sputum x 2 weeks. Patient states cough is worse at night.  Patient states he has been taking OTC cough and cold medicines with no relief.

## 2023-06-12 NOTE — Discharge Instructions (Signed)
 Take medication as directed. Return if symptoms do not improve after completing medications. If you develop difficulty breathing or fever, go to the nearest emergency department.

## 2023-06-12 NOTE — ED Provider Notes (Signed)
 [facility]    CSN: [id number] Arrival date & time: 06/12/23  7846      History   Chief Complaint Chief Complaint  Patient presents with   Cough    HPI Eric Love is a 78 y.o. male.   Patient here for evaluation with cough and yellow sputum x 2 weeks. Per patient  cough is worse at night.  He denies any history of chronic respiratory disease such as asthma, COPD.  Patient is a non-smoker.  He has not had fever since the onset of symptoms.  He endorses that the symptoms began with a cough.  He has taken multiple over-the-counter medications without relief of current symptoms.  He denies wheezing or shortness of breath.  Cough has become productive over the last few days. Past Medical History:  Diagnosis Date   Biliary dyskinesia    Dyspnea on exertion    Hypertension     Patient Active Problem List   Diagnosis Date Noted   Syncope 06/14/2010   Essential hypertension, benign 06/14/2010    Past Surgical History:  Procedure Laterality Date   INTRAOPERATIVE CHOLANGIOGRAM  2004   LAPAROSCOPIC CHOLECYSTECTOMY  2004       Home Medications    Prior to Admission medications   Medication Sig Start Date End Date Taking? Authorizing Provider  amoxicillin-clavulanate (AUGMENTIN) 875-125 MG tablet Take 1 tablet by mouth every 12 (twelve) hours for 10 days. 06/12/23 06/22/23 Yes Bing Neighbors, NP  predniSONE (DELTASONE) 20 MG tablet Take 1 tablet (20 mg total) by mouth daily with breakfast for 5 days. 06/12/23 06/17/23 Yes Bing Neighbors, NP  promethazine-dextromethorphan (PROMETHAZINE-DM) 6.25-15 MG/5ML syrup Take 5 mLs by mouth 3 (three) times daily as needed for cough. 06/12/23  Yes Bing Neighbors, NP  hydrOXYzine (ATARAX/VISTARIL) 25 MG tablet Take 1 tablet (25 mg total) by mouth at bedtime as needed for itching. 09/13/17   Wurst, [place], PA-C  lisinopril (PRINIVIL,ZESTRIL) 10 MG tablet Take 10 mg by mouth daily.      [provider]  meclizine  (ANTIVERT) 25 MG tablet Take 1 tablet (25 mg total) by mouth 3 (three) times daily as needed for dizziness. 03/20/17   Derwood Kaplan, MD  Multiple Vitamin (MULTIVITAMIN WITH MINERALS) TABS tablet Take 1 tablet by mouth daily.    [provider]  ondansetron (ZOFRAN ODT) 4 MG disintegrating tablet Take 1 tablet (4 mg total) by mouth every 8 (eight) hours as needed for nausea or vomiting. 03/20/17   Derwood Kaplan, MD  Polyvinyl Alcohol-Povidone (REFRESH OP) Apply 1 drop to eye at bedtime.    [provider]  tamsulosin (FLOMAX) 0.4 MG CAPS capsule Take 0.4 mg by mouth every evening. 01/04/17   [provider]  triamcinolone ointment (KENALOG) 0.5 % Apply 1 application topically 2 (two) times daily. 09/13/17   Rennis Harding, PA-C    Family History History reviewed. No pertinent family history.  Social History Social History   Tobacco Use   Smoking status: Never   Smokeless tobacco: Never  Vaping Use   Vaping status: Never Used  Substance Use Topics   Alcohol use: No   Drug use: No     Allergies   Patient has no known allergies.   Review of Systems Review of Systems  Respiratory:  Positive for cough.      Physical Exam Triage Vital Signs ED Triage Vitals [06/12/23 0953]  Encounter Vitals Group     BP 124/83     Systolic  BP Percentile      Diastolic BP Percentile      Pulse Rate 76     Resp 16     Temp 98.2 F (36.8 C)     Temp Source Oral     SpO2 94 %     Weight      Height      Head Circumference      Peak Flow      Pain Score 0     Pain Loc      Pain Education      Exclude from Growth Chart    No data found.  Updated Vital Signs BP 124/83 (BP Location: Left Arm)   Pulse 76   Temp 98.2 F (36.8 C) (Oral)   Resp 16   SpO2 94%   Visual Acuity Right Eye Distance:   Left Eye Distance:   Bilateral Distance:    Right Eye Near:   Left Eye Near:    Bilateral Near:     Physical Exam Vitals reviewed.  Constitutional:       Appearance: Normal appearance. He is not ill-appearing.  HENT:     Nose: Nose normal.  Eyes:     Extraocular Movements: Extraocular movements intact.     Pupils: Pupils are equal, round, and reactive to light.  Cardiovascular:     Rate and Rhythm: Normal rate and regular rhythm.  Pulmonary:     Breath sounds: Wheezing (expiratory wheeze) and rhonchi present.  Musculoskeletal:     Cervical back: Normal range of motion and neck supple.  Lymphadenopathy:     Cervical: No cervical adenopathy.  Skin:    General: Skin is warm and dry.  Neurological:     General: No focal deficit present.     Mental Status: He is alert.      UC Treatments / Results  Labs (all labs ordered are listed, but only abnormal results are displayed) Labs Reviewed - No data to display  EKG   Radiology No results found.  Procedures Procedures (including critical care time)  Medications Ordered in UC Medications - No data to display  Initial Impression / Assessment and Plan / UC Course  I have reviewed the triage vital signs and the nursing notes.  Pertinent labs & imaging results that were available during my care of the patient were reviewed by me and considered in my medical decision making (see chart for details).    Treating for an acute lower respiratory infection which was likely initially viral bronchitis however patient now is having symptoms of expiratory wheezing and persistent productive cough.  Will prescribe prednisone 20 mg once daily for acute expiratory wheezing and bronchial inflammation.  Will cover with an antibiotic given symptoms have been present for 2 weeks there is concern given patient's age for bronchitis transitioning into a pneumonia.  Covering with Augmentin twice daily for 10 days.  Promethazine DM 3 times daily as needed for cough.  Strict return precautions given if symptoms do not improve with treatment.  ER cautions given if symptoms become severe. Final Clinical  Impressions(s) / UC Diagnoses   Final diagnoses:  Acute lower respiratory infection     Discharge Instructions      Take medication as directed. Return if symptoms do not improve after completing medications. If you develop difficulty breathing or fever, go to the nearest emergency department.     ED Prescriptions     Medication Sig Dispense Auth. Provider   amoxicillin-clavulanate (AUGMENTIN) 875-125 MG  tablet Take 1 tablet by mouth every 12 (twelve) hours for 10 days. 20 tablet Bing Neighbors, NP   predniSONE (DELTASONE) 20 MG tablet Take 1 tablet (20 mg total) by mouth daily with breakfast for 5 days. 5 tablet Bing Neighbors, NP   promethazine-dextromethorphan (PROMETHAZINE-DM) 6.25-15 MG/5ML syrup Take 5 mLs by mouth 3 (three) times daily as needed for cough. 180 mL Bing Neighbors, NP      PDMP not reviewed this encounter.   Bing Neighbors, NP 06/12/23 1034
